# Patient Record
Sex: Male | Born: 1990 | Race: White | Hispanic: No | Marital: Married | Smoking: Never smoker
Health system: Southern US, Community
[De-identification: ages and names within clinical notes are randomized; demographics above are authoritative.]

## PROBLEM LIST (undated history)

## (undated) DIAGNOSIS — K219 Gastro-esophageal reflux disease without esophagitis: Secondary | ICD-10-CM

## (undated) DIAGNOSIS — M92529 Juvenile osteochondrosis of tibia tubercle, unspecified leg: Secondary | ICD-10-CM

## (undated) DIAGNOSIS — E7800 Pure hypercholesterolemia, unspecified: Secondary | ICD-10-CM

## (undated) DIAGNOSIS — M9250 Juvenile osteochondrosis of tibia and fibula, unspecified leg: Secondary | ICD-10-CM

---

## 2017-11-25 ENCOUNTER — Emergency Department (INDEPENDENT_AMBULATORY_CARE_PROVIDER_SITE_OTHER): Payer: 59

## 2017-11-25 ENCOUNTER — Other Ambulatory Visit: Payer: Self-pay

## 2017-11-25 ENCOUNTER — Emergency Department
Admission: EM | Admit: 2017-11-25 | Discharge: 2017-11-25 | Disposition: A | Discharge status: Home / Self Care | Payer: 59 | Source: Home / Self Care | Attending: Family Medicine | Admitting: Family Medicine

## 2017-11-25 DIAGNOSIS — S39012A Strain of muscle, fascia and tendon of lower back, initial encounter: Secondary | ICD-10-CM | POA: Diagnosis not present

## 2017-11-25 DIAGNOSIS — M5416 Radiculopathy, lumbar region: Secondary | ICD-10-CM | POA: Diagnosis not present

## 2017-11-25 HISTORY — DX: Juvenile osteochondrosis of tibia tubercle, unspecified leg: M92.529

## 2017-11-25 HISTORY — DX: Pure hypercholesterolemia, unspecified: E78.00

## 2017-11-25 HISTORY — DX: Juvenile osteochondrosis of tibia and fibula, unspecified leg: M92.50

## 2017-11-25 HISTORY — DX: Gastro-esophageal reflux disease without esophagitis: K21.9

## 2017-11-25 MED ORDER — PREDNISONE 20 MG PO TABS: ORAL_TABLET | ORAL | 0 refills | Status: DC

## 2017-11-25 MED ORDER — CYCLOBENZAPRINE HCL 10 MG PO TABS: 10.0000 mg | ORAL_TABLET | Freq: Three times a day (TID) | ORAL | 0 refills | Status: DC

## 2017-11-25 MED ORDER — HYDROCODONE-ACETAMINOPHEN 5-325 MG PO TABS: 1.0000 | ORAL_TABLET | Freq: Four times a day (QID) | ORAL | 0 refills | Status: DC | PRN

## 2017-11-25 MED ORDER — KETOROLAC TROMETHAMINE 30 MG/ML IJ SOLN
30.0000 mg | Freq: Once | INTRAMUSCULAR | Status: AC
  Administered 2017-11-25: 30 mg via INTRAMUSCULAR

## 2017-11-25 NOTE — Discharge Instructions (Signed)
Apply ice pack for 20 to 30 minutes, 3 to 4 times daily  Continue until pain and swelling decrease.  Use crutches for about 5 days, and begin walking as tolerated.  Begin range of motion and stretching exercises as tolerated.  After finishing prednisone, may begin Ibuprofen 200mg , 4 tabs every 8 hours with food.

## 2017-11-25 NOTE — ED Triage Notes (Signed)
Pt go out of bed this am and had severe back pain radiating down the left leg, stops in his calf.  Pain level a 10.  Took advil 600 mg about 1 hour ago.

## 2017-11-25 NOTE — ED Provider Notes (Signed)
[facility]    CSN: [id number] Arrival date & time: 11/25/17  0840     History   Chief Complaint Chief Complaint  Patient presents with  . Back Pain    HPI Patrick Velasquez is a 27 y.o. male.   About 45 minutes ago while climbing out of bed, patient felt sudden severe mid-low back pain radiating to both knees.  He denies past history of back pain.   He denies bowel or bladder dysfunction, and no saddle numbness.  He took 600mg  Ibuprofen prior to arrival   The history is provided by the patient.  Back Pain  Location:  Lumbar spine Quality:  Stabbing Radiates to:  L knee and R knee Pain severity:  Severe Pain is:  Same all the time Onset quality:  Sudden Duration:  1 hour Timing:  Constant Progression:  Unchanged Chronicity:  New Context: not recent illness and not recent injury   Relieved by:  Nothing Worsened by:  Movement Ineffective treatments:  NSAIDs Associated symptoms: paresthesias and tingling   Associated symptoms: no abdominal pain, no abdominal swelling, no bladder incontinence, no bowel incontinence, no chest pain, no dysuria, no fever, no headaches, no leg pain, no numbness, no pelvic pain, no perianal numbness, no weakness and no weight loss     Past Medical History:  Diagnosis Date  . Acid reflux   . High cholesterol   . Osgood-Schlatter's disease     There are no active problems to display for this patient.   History reviewed. No pertinent surgical history.     Home Medications    Prior to Admission medications   Medication Sig Start Date End Date Taking? Authorizing Provider  cyclobenzaprine (FLEXERIL) 10 MG tablet Take 1 tablet (10 mg total) by mouth 3 (three) times daily. 11/25/17   Lattie Haw, MD  HYDROcodone-acetaminophen (NORCO/VICODIN) 5-325 MG tablet Take 1 tablet by mouth every 6 (six) hours as needed for moderate pain. 11/25/17   Lattie Haw, MD  predniSONE (DELTASONE) 20 MG tablet Take one tab by mouth twice  daily for 4 days, then one daily for 3 days. Take with food. 11/25/17   Lattie Haw, MD    Family History History reviewed. No pertinent family history.  Social History Social History   Tobacco Use  . Smoking status: Never Smoker  . Smokeless tobacco: Never Used  Substance Use Topics  . Alcohol use: Yes  . Drug use: No     Allergies   Patient has no known allergies.   Review of Systems Review of Systems  Constitutional: Negative for fever and weight loss.  Cardiovascular: Negative for chest pain.  Gastrointestinal: Negative for abdominal pain and bowel incontinence.  Genitourinary: Negative for bladder incontinence, dysuria and pelvic pain.  Musculoskeletal: Positive for back pain.  Neurological: Positive for tingling and paresthesias. Negative for weakness, numbness and headaches.  All other systems reviewed and are negative.    Physical Exam Triage Vital Signs ED Triage Vitals  Enc Vitals Group     BP 11/25/17 0845 (!) 173/69     Pulse Rate 11/25/17 0845 77     Resp --      Temp 11/25/17 0845 97.7 F (36.5 C)     Temp Source 11/25/17 0845 Oral     SpO2 11/25/17 0845 100 %     Weight 11/25/17 0845 180 lb (81.6 kg)     Height 11/25/17 0845 6\' 3"  (1.905 m)     Head Circumference --  Peak Flow --      Pain Score 11/25/17 0846 10     Pain Loc --      Pain Edu? --      Excl. in GC? --    No data found.  Updated Vital Signs BP (!) 173/69 (BP Location: Right Arm)   Pulse 77   Temp 97.7 F (36.5 C) (Oral)   Ht 6\' 3"  (1.905 m)   Wt 180 lb (81.6 kg)   SpO2 100%   BMI 22.50 kg/m   Visual Acuity Right Eye Distance:   Left Eye Distance:   Bilateral Distance:    Right Eye Near:   Left Eye Near:    Bilateral Near:     Physical Exam  Constitutional: He appears well-developed and well-nourished. He appears distressed.  HENT:  Head: Normocephalic.  Right Ear: External ear normal.  Left Ear: External ear normal.  Mouth/Throat: Oropharynx is  clear and moist.  Eyes: EOM are normal. Pupils are equal, round, and reactive to light.  Neck: Normal range of motion.  Cardiovascular: Normal heart sounds.  Pulmonary/Chest: Breath sounds normal.  Abdominal: Soft. There is no tenderness.  Musculoskeletal: He exhibits no edema.       Lumbar back: He exhibits decreased range of motion and pain. He exhibits no tenderness, no bony tenderness, no swelling and no edema.       Back:  Back:  Range of motion decreased.  Patient experiences pain in primarily the midline and left paraspinous muscles from L2 to Sacral area, but there is no tenderness to palpation in this area. Straight leg raising test is negative.  Sitting knee extension test is negative.  Strength and sensation in the lower extremities is normal.  Patellar and achilles reflexes are normal   Neurological: He is alert.  Skin: Skin is warm and dry. He is not diaphoretic.     UC Treatments / Results  Labs (all labs ordered are listed, but only abnormal results are displayed) Labs Reviewed - No data to display  EKG  EKG Interpretation None       Radiology Dg Lumbar Spine Complete  Result Date: 11/25/2017 CLINICAL DATA:  Lumbago with lower extremity radicular symptoms EXAM: LUMBAR SPINE - COMPLETE 4+ VIEW COMPARISON:  None. FINDINGS: Frontal, lateral, spot lumbosacral lateral, and bilateral oblique views were obtained. There are 5 non-rib-bearing lumbar type vertebral bodies. There is no fracture or spondylolisthesis. Disc spaces appear unremarkable. There is no appreciable facet arthropathy. IMPRESSION: No fracture or spondylolisthesis.  No appreciable arthropathy. Electronically Signed   By: Bretta BangWilliam  Woodruff III M.D.   On: 11/25/2017 09:36    Procedures Procedures (including critical care time)  Medications Ordered in UC Medications  ketorolac (TORADOL) 30 MG/ML injection 30 mg (30 mg Intramuscular Given 11/25/17 0859)     Initial Impression / Assessment and Plan / UC  Course  I have reviewed the triage vital signs and the nursing notes.  Pertinent labs & imaging results that were available during my care of the patient were reviewed by me and considered in my medical decision making (see chart for details).    Administered Toradol 30mg  IM Begin prednisone burst/taper.  Rx for Flexeril. Rx for Lortab for pain control. Controlled Substance Prescriptions I have consulted the [place] Controlled Substances Registry for this patient, and feel the risk/benefit ratio today is favorable for proceeding with this prescription for a controlled substance.   Dispensed crutches. Apply ice pack for 20 to 30 minutes, 3 to 4 times  daily  Continue until pain and swelling decrease.  Use crutches for about 5 days, and begin walking as tolerated.  Begin range of motion and stretching exercises as tolerated.  After finishing prednisone, may begin Ibuprofen 200mg , 4 tabs every 8 hours with food. Followup with Dr. Rodney Langton or Dr. Clementeen Graham ([facility]) if not improving about two weeks.     Final Clinical Impressions(s) / UC Diagnoses   Final diagnoses:  Strain of lumbar region, initial encounter    ED Discharge Orders        Ordered    predniSONE (DELTASONE) 20 MG tablet     11/25/17 0953    cyclobenzaprine (FLEXERIL) 10 MG tablet  3 times daily     11/25/17 0953    HYDROcodone-acetaminophen (NORCO/VICODIN) 5-325 MG tablet  Every 6 hours PRN     11/25/17 0953           Lattie Haw, MD 11/25/17 1008

## 2018-05-26 ENCOUNTER — Other Ambulatory Visit: Payer: Self-pay

## 2018-05-26 ENCOUNTER — Encounter: Payer: Self-pay | Admitting: *Deleted

## 2018-05-26 ENCOUNTER — Emergency Department
Admission: EM | Admit: 2018-05-26 | Discharge: 2018-05-26 | Disposition: A | Discharge status: Home / Self Care | Payer: Self-pay | Source: Home / Self Care | Attending: Family Medicine | Admitting: Family Medicine

## 2018-05-26 DIAGNOSIS — H6502 Acute serous otitis media, left ear: Secondary | ICD-10-CM

## 2018-05-26 DIAGNOSIS — J069 Acute upper respiratory infection, unspecified: Secondary | ICD-10-CM

## 2018-05-26 MED ORDER — BENZONATATE 200 MG PO CAPS: ORAL_CAPSULE | ORAL | 0 refills | Status: DC

## 2018-05-26 MED ORDER — AZITHROMYCIN 250 MG PO TABS: ORAL_TABLET | ORAL | 0 refills | Status: DC

## 2018-05-26 NOTE — Discharge Instructions (Addendum)
Take plain guaifenesin (1200mg extended release tabs such as Mucinex) twice daily, with plenty of water, for cough and congestion.  May add Pseudoephedrine (30mg, one or two every 4 to 6 hours) for sinus congestion.  Get adequate rest.   °May use Afrin nasal spray (or generic oxymetazoline) each morning for about 5 days and then discontinue.  Also recommend using saline nasal spray several times daily and saline nasal irrigation (AYR is a common brand).  Use Flonase nasal spray each morning after using Afrin nasal spray and saline nasal irrigation. °Try warm salt water gargles for sore throat.  °Stop all antihistamines for now, and other non-prescription cough/cold preparations. °May take Ibuprofen 200mg, 4 tabs every 8 hours with food for fever, body aches, etc. °May take Delsym Cough Suppressant with Tessalon at bedtime for nighttime cough.  °

## 2018-05-26 NOTE — ED Provider Notes (Signed)
[facility]    CSN: [id number] Arrival date & time: 05/26/18  0900     History   Chief Complaint Chief Complaint  Patient presents with  . Cough    HPI Patrick Velasquez is a 27 y.o. male.   Patient developed fever, fatigue, arthralgias, and cough 3 days ago.  The fever has persisted, and he has had chills/sweats.  He has developed sinus congestion but no sore throat.  His cough is non-productive and worse at night.  He denies shortness of breath or pleuritic pain.  He has a past history of frequent childhood otitis media and ventilation tubes. His wife is approximately [redacted] weeks pregnant.  The history is provided by the patient.    Past Medical History:  Diagnosis Date  . Acid reflux   . High cholesterol   . Osgood-Schlatter's disease     There are no active problems to display for this patient.   History reviewed. No pertinent surgical history.     Home Medications    Prior to Admission medications   Medication Sig Start Date End Date Taking? Authorizing Provider  azithromycin (ZITHROMAX Z-PAK) 250 MG tablet Take 2 tabs today; then begin one tab once daily for 4 more days. 05/26/18   Lattie HawBeese, Asli Tokarski A, MD  benzonatate (TESSALON) 200 MG capsule Take one cap by mouth at bedtime as needed for cough.  May repeat in 4 to 6 hours 05/26/18   Lattie HawBeese, Chrisa Hassan A, MD    Family History History reviewed. No pertinent family history.  Social History Social History   Tobacco Use  . Smoking status: Never Smoker  . Smokeless tobacco: Never Used  Substance Use Topics  . Alcohol use: Yes  . Drug use: No     Allergies   Patient has no known allergies.   Review of Systems Review of Systems No sore throat + cough No pleuritic pain No wheezing + nasal congestion + post-nasal drainage No sinus pain/pressure No itchy/red eyes ? earache No hemoptysis No SOB + fever, + chills No nausea No vomiting No abdominal pain No diarrhea No urinary symptoms No skin  rash + fatigue + myalgias No headache Used OTC meds without relief  Physical Exam Triage Vital Signs ED Triage Vitals  Enc Vitals Group     BP 05/26/18 0925 (!) 143/90     Pulse Rate 05/26/18 0925 90     Resp 05/26/18 0925 18     Temp 05/26/18 0925 99.5 F (37.5 C)     Temp Source 05/26/18 0925 Oral     SpO2 05/26/18 0925 99 %     Weight 05/26/18 0926 184 lb (83.5 kg)     Height 05/26/18 0926 6\' 3"  (1.905 m)     Head Circumference --      Peak Flow --      Pain Score 05/26/18 0926 0     Pain Loc --      Pain Edu? --      Excl. in GC? --    No data found.  Updated Vital Signs BP (!) 143/90 (BP Location: Right Arm)   Pulse 90   Temp 99.5 F (37.5 C) (Oral)   Resp 18   Ht 6\' 3"  (1.905 m)   Wt 184 lb (83.5 kg)   SpO2 99%   BMI 23.00 kg/m   Visual Acuity Right Eye Distance:   Left Eye Distance:   Bilateral Distance:    Right Eye Near:   Left Eye Near:  Bilateral Near:     Physical Exam Nursing notes and Vital Signs reviewed. Appearance:  Patient appears stated age, and in no acute distress Eyes:  Pupils are equal, round, and reactive to light and accomodation.  Extraocular movement is intact.  Conjunctivae are not inflamed  Ears:  Canals normal.  Right tympanic membrane normal.  Left tympanic membrane has serous effusion. Nose:  Congested turbinates.  No sinus tenderness.  Pharynx:  Normal Neck:  Supple.  No adenopathy..   Lungs:  Clear to auscultation.  Breath sounds are equal.  Moving air well. Heart:  Regular rate and rhythm without murmurs, rubs, or gallops.  Abdomen:  Nontender without masses or hepatosplenomegaly.  Bowel sounds are present.  No CVA or flank tenderness.  Extremities:  No edema.  Skin:  No rash present.    UC Treatments / Results  Labs (all labs ordered are listed, but only abnormal results are displayed) Labs Reviewed - No data to display  EKG None  Radiology No results found.  Procedures Procedures (including critical care  time)  Medications Ordered in UC Medications - No data to display  Initial Impression / Assessment and Plan / UC Course  I have reviewed the triage vital signs and the nursing notes.  Pertinent labs & imaging results that were available during my care of the patient were reviewed by me and considered in my medical decision making (see chart for details).    Begin Z-pak.  Prescription written for Benzonatate [facility]) to take at bedtime for night-time cough.  Followup with Family Doctor if not improved in 7 to 10 days.   Final Clinical Impressions(s) / UC Diagnoses   Final diagnoses:  Upper respiratory tract infection, unspecified type  Acute serous otitis media of left ear, recurrence not specified     Discharge Instructions     Take plain guaifenesin (1200mg  extended release tabs such as Mucinex) twice daily, with plenty of water, for cough and congestion.  May add Pseudoephedrine (30mg , one or two every 4 to 6 hours) for sinus congestion.  Get adequate rest.   May use Afrin nasal spray (or generic oxymetazoline) each morning for about 5 days and then discontinue.  Also recommend using saline nasal spray several times daily and saline nasal irrigation (AYR is a common brand).  Use Flonase nasal spray each morning after using Afrin nasal spray and saline nasal irrigation. Try warm salt water gargles for sore throat.  Stop all antihistamines for now, and other non-prescription cough/cold preparations. May take Ibuprofen 200mg , 4 tabs every 8 hours with food for fever, body aches, etc. May take Delsym Cough Suppressant with Tessalon at bedtime for nighttime cough.       ED Prescriptions    Medication Sig Dispense Auth. Provider   azithromycin (ZITHROMAX Z-PAK) 250 MG tablet Take 2 tabs today; then begin one tab once daily for 4 more days. 6 tablet Lattie Haw, MD   benzonatate (TESSALON) 200 MG capsule Take one cap by mouth at bedtime as needed for cough.  May repeat in 4 to  6 hours 15 capsule Cathren Harsh Tera Mater, MD        Lattie Haw, MD 05/26/18 [phone]

## 2018-05-26 NOTE — ED Triage Notes (Signed)
Pt c/o nonproductive cough, temp up to 101 F and nasal congestion x  3 days.

## 2018-06-17 DIAGNOSIS — M609 Myositis, unspecified: Secondary | ICD-10-CM | POA: Diagnosis not present

## 2018-06-17 DIAGNOSIS — M545 Low back pain: Secondary | ICD-10-CM | POA: Diagnosis not present

## 2018-06-17 DIAGNOSIS — M5387 Other specified dorsopathies, lumbosacral region: Secondary | ICD-10-CM | POA: Diagnosis not present

## 2018-06-17 DIAGNOSIS — M9903 Segmental and somatic dysfunction of lumbar region: Secondary | ICD-10-CM | POA: Diagnosis not present

## 2018-07-01 DIAGNOSIS — M9903 Segmental and somatic dysfunction of lumbar region: Secondary | ICD-10-CM | POA: Diagnosis not present

## 2018-07-01 DIAGNOSIS — M9901 Segmental and somatic dysfunction of cervical region: Secondary | ICD-10-CM | POA: Diagnosis not present

## 2018-07-01 DIAGNOSIS — M609 Myositis, unspecified: Secondary | ICD-10-CM | POA: Diagnosis not present

## 2018-07-01 DIAGNOSIS — M545 Low back pain: Secondary | ICD-10-CM | POA: Diagnosis not present

## 2018-07-15 DIAGNOSIS — M609 Myositis, unspecified: Secondary | ICD-10-CM | POA: Diagnosis not present

## 2018-07-15 DIAGNOSIS — M545 Low back pain: Secondary | ICD-10-CM | POA: Diagnosis not present

## 2018-07-15 DIAGNOSIS — M9903 Segmental and somatic dysfunction of lumbar region: Secondary | ICD-10-CM | POA: Diagnosis not present

## 2018-07-15 DIAGNOSIS — M5387 Other specified dorsopathies, lumbosacral region: Secondary | ICD-10-CM | POA: Diagnosis not present

## 2018-07-29 DIAGNOSIS — M9903 Segmental and somatic dysfunction of lumbar region: Secondary | ICD-10-CM | POA: Diagnosis not present

## 2018-07-29 DIAGNOSIS — M545 Low back pain: Secondary | ICD-10-CM | POA: Diagnosis not present

## 2018-07-29 DIAGNOSIS — M609 Myositis, unspecified: Secondary | ICD-10-CM | POA: Diagnosis not present

## 2018-07-29 DIAGNOSIS — M5387 Other specified dorsopathies, lumbosacral region: Secondary | ICD-10-CM | POA: Diagnosis not present

## 2018-08-12 DIAGNOSIS — M5387 Other specified dorsopathies, lumbosacral region: Secondary | ICD-10-CM | POA: Diagnosis not present

## 2018-08-12 DIAGNOSIS — M545 Low back pain: Secondary | ICD-10-CM | POA: Diagnosis not present

## 2018-08-12 DIAGNOSIS — M9903 Segmental and somatic dysfunction of lumbar region: Secondary | ICD-10-CM | POA: Diagnosis not present

## 2018-08-12 DIAGNOSIS — M609 Myositis, unspecified: Secondary | ICD-10-CM | POA: Diagnosis not present

## 2018-08-15 DIAGNOSIS — Z23 Encounter for immunization: Secondary | ICD-10-CM | POA: Diagnosis not present

## 2018-08-15 DIAGNOSIS — Z0000 Encounter for general adult medical examination without abnormal findings: Secondary | ICD-10-CM | POA: Diagnosis not present

## 2018-08-15 DIAGNOSIS — K219 Gastro-esophageal reflux disease without esophagitis: Secondary | ICD-10-CM | POA: Diagnosis not present

## 2018-08-15 DIAGNOSIS — Z6823 Body mass index (BMI) 23.0-23.9, adult: Secondary | ICD-10-CM | POA: Diagnosis not present

## 2018-08-26 DIAGNOSIS — M545 Low back pain: Secondary | ICD-10-CM | POA: Diagnosis not present

## 2018-08-26 DIAGNOSIS — M609 Myositis, unspecified: Secondary | ICD-10-CM | POA: Diagnosis not present

## 2018-08-26 DIAGNOSIS — M5387 Other specified dorsopathies, lumbosacral region: Secondary | ICD-10-CM | POA: Diagnosis not present

## 2018-08-26 DIAGNOSIS — M9903 Segmental and somatic dysfunction of lumbar region: Secondary | ICD-10-CM | POA: Diagnosis not present

## 2019-05-19 ENCOUNTER — Other Ambulatory Visit: Payer: Self-pay

## 2019-05-19 ENCOUNTER — Emergency Department (INDEPENDENT_AMBULATORY_CARE_PROVIDER_SITE_OTHER)
Admission: EM | Admit: 2019-05-19 | Discharge: 2019-05-19 | Disposition: A | Discharge status: Home / Self Care | Payer: Self-pay | Source: Home / Self Care

## 2019-05-19 ENCOUNTER — Encounter: Payer: Self-pay | Admitting: *Deleted

## 2019-05-19 DIAGNOSIS — M5442 Lumbago with sciatica, left side: Secondary | ICD-10-CM

## 2019-05-19 MED ORDER — CYCLOBENZAPRINE HCL 5 MG PO TABS: 5.0000 mg | ORAL_TABLET | Freq: Two times a day (BID) | ORAL | 0 refills | Status: AC | PRN

## 2019-05-19 MED ORDER — PREDNISONE 50 MG PO TABS: 50.0000 mg | ORAL_TABLET | Freq: Every day | ORAL | 0 refills | Status: AC

## 2019-05-19 MED ORDER — METHYLPREDNISOLONE ACETATE 80 MG/ML IJ SUSP
80.0000 mg | Freq: Once | INTRAMUSCULAR | Status: AC
  Administered 2019-05-19: 80 mg via INTRAMUSCULAR

## 2019-05-19 NOTE — ED Triage Notes (Signed)
Pt c/o pain in the center of his lower back radiating to his legs; LT leg is worse. Denies injury. Took IBF yesterday.

## 2019-05-19 NOTE — ED Provider Notes (Signed)
[facility]    CSN: [id number] Arrival date & time: 05/19/19  2671     History   Chief Complaint Chief Complaint  Patient presents with  . Back Pain    HPI Patrick Velasquez is a 28 y.o. male.   HPI Patrick Velasquez is a 28 y.o. male presenting to UC with c/o sudden onset lower mid back pain with radiation of pain down his legs, predominantly down his Left thigh.  Pain started yesterday while driving his car. Hx of similar pain about 2 years ago that started when he was putting socks on. Pain at that time brought him to his knees.  Pain is 5/10 at this time, worse with certain movements.  He did take ibuprofen yesterday with mild relief, no medication taken today.  He reports doing well with prednisone and the muscle relaxer last time and had gone to a chiropractor for about 1 year.  This past year, his wife gave birth to their first child.  She is now 79mo old and starting to cruise. Pt believes bending down to pick up his growing daughter could be contributing to his current back pain.  Denies change in bowel or bladder habits. No numbness or tingling in arms, legs or groin.    Past Medical History:  Diagnosis Date  . Acid reflux   . High cholesterol   . Osgood-Schlatter's disease     There are no active problems to display for this patient.   History reviewed. No pertinent surgical history.     Home Medications    Prior to Admission medications   Medication Sig Start Date End Date Taking? Authorizing Provider  cyclobenzaprine (FLEXERIL) 5 MG tablet Take 1-2 tablets (5-10 mg total) by mouth 2 (two) times daily as needed for muscle spasms. 05/19/19   Noe Gens, PA-C  predniSONE (DELTASONE) 50 MG tablet Take 1 tablet (50 mg total) by mouth daily with breakfast for 5 days. 05/19/19 05/24/19  Noe Gens, PA-C    Family History History reviewed. No pertinent family history.  Social History Social History   Tobacco Use  . Smoking status: Never Smoker  .  Smokeless tobacco: Never Used  Substance Use Topics  . Alcohol use: Yes  . Drug use: No     Allergies   Patient has no known allergies.   Review of Systems Review of Systems  Genitourinary: Negative for dysuria, flank pain and frequency.  Musculoskeletal: Positive for back pain and myalgias. Negative for gait problem.  Neurological: Negative for weakness and numbness.     Physical Exam Triage Vital Signs ED Triage Vitals  Enc Vitals Group     BP      Pulse      Resp      Temp      Temp src      SpO2      Weight      Height      Head Circumference      Peak Flow      Pain Score      Pain Loc      Pain Edu?      Excl. in [place]?    No data found.  Updated Vital Signs BP (!) 166/68 (BP Location: Right Arm)   Pulse 63   Temp 98.4 F (36.9 C) (Oral)   Resp 18   SpO2 100%   Visual Acuity Right Eye Distance:   Left Eye Distance:   Bilateral Distance:    Right  Eye Near:   Left Eye Near:    Bilateral Near:     Physical Exam Vitals signs and nursing note reviewed.  Constitutional:      Appearance: Normal appearance. He is well-developed.  HENT:     Head: Normocephalic and atraumatic.  Neck:     Musculoskeletal: Normal range of motion.  Cardiovascular:     Rate and Rhythm: Normal rate and regular rhythm.  Pulmonary:     Effort: Pulmonary effort is normal.     Breath sounds: Normal breath sounds.  Musculoskeletal: Normal range of motion.        General: Tenderness present.     Comments: Mild tenderness to lower lumbar spine and surrounding lumbar muscles. No step-offs or crepitus.  Negative straight leg raise.  Slight antalgic gait, improves after pt takes a few steps.   Skin:    General: Skin is warm and dry.     Findings: No erythema or rash.  Neurological:     General: No focal deficit present.     Mental Status: He is alert and oriented to person, place, and time.  Psychiatric:        Behavior: Behavior normal.      UC Treatments / Results   Labs (all labs ordered are listed, but only abnormal results are displayed) Labs Reviewed - No data to display  EKG   Radiology No results found.  Procedures Procedures (including critical care time)  Medications Ordered in UC Medications  methylPREDNISolone acetate (DEPO-MEDROL) injection 80 mg (80 mg Intramuscular Given 05/19/19 0916)    Initial Impression / Assessment and Plan / UC Course  I have reviewed the triage vital signs and the nursing notes.  Pertinent labs & imaging results that were available during my care of the patient were reviewed by me and considered in my medical decision making (see chart for details).     No red flag symptoms Hx and exam c/w low back strain with Left side sciatica Reviewed PMH Will tx conservatively Resource guide for Sports Medicine provided.  Final Clinical Impressions(s) / UC Diagnoses   Final diagnoses:  Acute midline low back pain with left-sided sciatica     Discharge Instructions      You may take 500mg  acetaminophen every 4-6 hours or in combination with ibuprofen 400-600mg  every 6-8 hours as needed for pain and inflammation.  You were given a shot of depo-medrol (a steroid) today to help with muscle pain and swelling.  You have been prescribed prednisone, an oral steroid.  You may start this medication tomorrow with breakfast.    Taking an antiinflammatory medication with prednisone can increase your risk of stomach ulcer and/or stomach bleeds.  It is recommended you take these medications with food and do not take more medication than needed.    Please follow up with Sports Medicine in 1-2 weeks if not improving, or for ongoing treatment of recurrent low back pain.      ED Prescriptions    Medication Sig Dispense Auth. Provider   predniSONE (DELTASONE) 50 MG tablet Take 1 tablet (50 mg total) by mouth daily with breakfast for 5 days. 5 tablet Waylan RocherPhelps, Kayloni Rocco O, PA-C   cyclobenzaprine (FLEXERIL) 5 MG tablet Take 1-2  tablets (5-10 mg total) by mouth 2 (two) times daily as needed for muscle spasms. 30 tablet Lurene ShadowPhelps, Jaysun Wessels O, PA-C     Controlled Substance Prescriptions  Controlled Substance Registry consulted? Not Applicable   Rolla Platehelps, Latifa Noble O, PA-C 05/19/19 1154

## 2019-05-19 NOTE — Discharge Instructions (Signed)
°  You may take 500mg  acetaminophen every 4-6 hours or in combination with ibuprofen 400-600mg  every 6-8 hours as needed for pain and inflammation.  You were given a shot of depo-medrol (a steroid) today to help with muscle pain and swelling.  You have been prescribed prednisone, an oral steroid.  You may start this medication tomorrow with breakfast.    Taking an antiinflammatory medication with prednisone can increase your risk of stomach ulcer and/or stomach bleeds.  It is recommended you take these medications with food and do not take more medication than needed.    Please follow up with Sports Medicine in 1-2 weeks if not improving, or for ongoing treatment of recurrent low back pain.

## 2019-05-27 ENCOUNTER — Other Ambulatory Visit: Payer: Self-pay

## 2019-05-27 ENCOUNTER — Encounter: Payer: Self-pay | Admitting: Sports Medicine

## 2019-05-27 ENCOUNTER — Ambulatory Visit (INDEPENDENT_AMBULATORY_CARE_PROVIDER_SITE_OTHER): Payer: BC Managed Care – PPO

## 2019-05-27 ENCOUNTER — Ambulatory Visit (INDEPENDENT_AMBULATORY_CARE_PROVIDER_SITE_OTHER): Payer: BC Managed Care – PPO | Admitting: Sports Medicine

## 2019-05-27 DIAGNOSIS — G8929 Other chronic pain: Secondary | ICD-10-CM

## 2019-05-27 DIAGNOSIS — M545 Low back pain, unspecified: Secondary | ICD-10-CM | POA: Insufficient documentation

## 2019-05-27 MED ORDER — PREDNISONE 10 MG (48) PO TBPK: ORAL_TABLET | Freq: Every day | ORAL | 0 refills | Status: AC

## 2019-05-27 NOTE — Progress Notes (Signed)
Subjective:    CC: Chronic low back pain  HPI:  Patrick Velasquez is a very pleasant 28 year old male, for the past year and a half he has had pain in his bilateral low back, worse with sitting, flexion, Valsalva, radiation into the buttocks and thighs but not past the knees, no progressive weakness, he has failed greater than 6 weeks of physician directed conservative measures.   I reviewed the past medical history, family history, social history, surgical history, and allergies today and no changes were needed.  Please see the problem list section below in epic for further details.  Past Medical History: Past Medical History:  Diagnosis Date  . Acid reflux   . High cholesterol   . Osgood-Schlatter's disease    Past Surgical History: No past surgical history on file. Social History: Social History   Socioeconomic History  . Marital status: Married    Spouse name: Not on file  . Number of children: Not on file  . Years of education: Not on file  . Highest education level: Not on file  Occupational History  . Not on file  Social Needs  . Financial resource strain: Not on file  . Food insecurity    Worry: Not on file    Inability: Not on file  . Transportation needs    Medical: Not on file    Non-medical: Not on file  Tobacco Use  . Smoking status: Never Smoker  . Smokeless tobacco: Never Used  Substance and Sexual Activity  . Alcohol use: Yes  . Drug use: No  . Sexual activity: Not on file  Lifestyle  . Physical activity    Days per week: Not on file    Minutes per session: Not on file  . Stress: Not on file  Relationships  . Social Herbalist on phone: Not on file    Gets together: Not on file    Attends religious service: Not on file    Active member of club or organization: Not on file    Attends meetings of clubs or organizations: Not on file    Relationship status: Not on file  Other Topics Concern  . Not on file  Social History Narrative  . Not on  file   Family History: No family history on file. Allergies: No Known Allergies Medications: See med rec.  Review of Systems: No headache, visual changes, nausea, vomiting, diarrhea, constipation, dizziness, abdominal pain, skin rash, fevers, chills, night sweats, swollen lymph nodes, weight loss, chest pain, body aches, joint swelling, muscle aches, shortness of breath, mood changes, visual or auditory hallucinations.  Objective:    General: Well Developed, well nourished, and in no acute distress.  Neuro: Alert and oriented x3, extra-ocular muscles intact, sensation grossly intact.  HEENT: Normocephalic, atraumatic, pupils equal round reactive to light, neck supple, no masses, no lymphadenopathy, thyroid nonpalpable.  Skin: Warm and dry, no rashes noted.  Cardiac: Regular rate and rhythm, no murmurs rubs or gallops.  Respiratory: Clear to auscultation bilaterally. Not using accessory muscles, speaking in full sentences.  Abdominal: Soft, nontender, nondistended, positive bowel sounds, no masses, no organomegaly.  Back Exam:  Inspection: Unremarkable  Motion: Flexion 45 deg, Extension 45 deg, Side Bending to 45 deg bilaterally,  Rotation to 45 deg bilaterally  SLR laying: Negative  XSLR laying: Negative  Palpable tenderness: None. FABER: negative. Sensory change: Gross sensation intact to all lumbar and sacral dermatomes.  Reflexes: 2+ at both patellar tendons, 2+ at achilles tendons, Babinski's  downgoing.  Strength at foot  Plantar-flexion: 5/5 Dorsi-flexion: 5/5 Eversion: 5/5 Inversion: 5/5  Leg strength  Quad: 5/5 Hamstring: 5/5 Hip flexor: 5/5 Hip abductors: 5/5  Gait unremarkable.  Decreased space between the L5 and S1 vertebrae  Impression and Recommendations:    The patient was counselled, risk factors were discussed, anticipatory guidance given.  Low back pain Axial discogenic back pain without progressive weakness or radiculopathy. It has been present for a year  and a half now in spite of physician directed conservative measures. For this reason we are getting an updated x-ray, MRI, formal PT, and a 12-day prednisone taper. Return to see me in 6 weeks, interventional treatment if no better.   ___________________________________________ Ihor Austinhomas J. Benjamin Stainhekkekandam, M.D., ABFM., CAQSM. Primary Care and Sports Medicine [facility]  Adjunct Professor of Family Medicine  [employer]

## 2019-05-27 NOTE — Assessment & Plan Note (Signed)
Axial discogenic back pain without progressive weakness or radiculopathy. It has been present for a year and a half now in spite of physician directed conservative measures. For this reason we are getting an updated x-ray, MRI, formal PT, and a 12-day prednisone taper. Return to see me in 6 weeks, interventional treatment if no better.

## 2019-06-07 ENCOUNTER — Ambulatory Visit (INDEPENDENT_AMBULATORY_CARE_PROVIDER_SITE_OTHER): Payer: BC Managed Care – PPO

## 2019-06-07 ENCOUNTER — Other Ambulatory Visit: Payer: Self-pay

## 2019-06-07 DIAGNOSIS — M545 Low back pain: Secondary | ICD-10-CM

## 2019-06-07 DIAGNOSIS — G8929 Other chronic pain: Secondary | ICD-10-CM | POA: Diagnosis not present

## 2019-06-08 ENCOUNTER — Ambulatory Visit: Payer: Self-pay | Admitting: Rehabilitative and Restorative Service Providers"

## 2019-06-10 ENCOUNTER — Other Ambulatory Visit: Payer: Self-pay

## 2019-06-10 ENCOUNTER — Ambulatory Visit (INDEPENDENT_AMBULATORY_CARE_PROVIDER_SITE_OTHER): Payer: BC Managed Care – PPO | Admitting: Physical Therapy

## 2019-06-10 ENCOUNTER — Encounter: Payer: Self-pay | Admitting: Physical Therapy

## 2019-06-10 DIAGNOSIS — G8929 Other chronic pain: Secondary | ICD-10-CM

## 2019-06-10 DIAGNOSIS — M545 Low back pain, unspecified: Secondary | ICD-10-CM

## 2019-06-10 NOTE — Therapy (Signed)
[facility] [address] [place], [place], [address] Phone: [phone]   Fax:  [phone]  Physical Therapy Evaluation  Patient Details  Name: Oren BeckmannSamuel Genis MRN: [id number] Date of Birth: 1991/03/12 Referring Provider (PT): Rodney Langtonhomas Thekkekandam MD   Encounter Date: 06/10/2019  PT End of Session - 06/10/19 1106    Visit Number  1    Number of Visits  8    Date for PT Re-Evaluation  07/08/19    PT Start Time  1106    PT Stop Time  1148    PT Time Calculation (min)  42 min    Activity Tolerance  Patient tolerated treatment well    Behavior During Therapy  [facility] for tasks assessed/performed       Past Medical History:  Diagnosis Date  . Acid reflux   . High cholesterol   . Osgood-Schlatter's disease     History reviewed. No pertinent surgical history.  There were no vitals filed for this visit.   Subjective Assessment - 06/10/19 1109    Subjective  January 2019 patient had episode of severe backpain with radiculopathy. Went to ER. Resolved in one month. About 6 weeks ago, pt was driving in his car and felt pain bil to knees and in back. MRI shows central disc protrusion at L5/S1. Pain has resolved mostly. He stil feels pressure. No LE radiculopathy for aleast a week.    Pertinent History  disc protrusion L5/S1    How long can you sit comfortably?  min    Diagnostic tests  mri    Patient Stated Goals  to help with his posture and prevent further back pain    Currently in Pain?  No/denies         [facility] - 06/10/19 0001      Assessment   Medical Diagnosis  chronic bil LBP without sciatica    Referring Provider (PT)  Rodney Langtonhomas Thekkekandam MD    Onset Date/Surgical Date  04/29/19    Hand Dominance  Right    Next MD Visit  after therapy    Prior Therapy  no      Precautions   Precaution Comments  advised to use stand up desk      Balance Screen   Has the patient fallen in the past 6 months  No    Has the patient had a decrease in activity level because of a fear of falling?   No    Is the patient reluctant to leave their home because of a fear of falling?   No      Home Public house managernvironment   Living Environment  Private residence    Living Arrangements  Spouse/significant other      Prior Function   Level of Independence  Independent    Vocation  Full time employment    Vocation Requirements  computer work    Leisure  new child      Posture/Postural Control   Posture Comments  Posture WNL other than rounded shoulders and forward head      ROM / Strength   AROM / PROM / Strength  AROM;Strength      AROM   AROM Assessment Site  Lumbar    Lumbar Flexion  limited by HS    Lumbar Extension  full    Lumbar - Right Side Bend  full with tightness left side    Lumbar - Left Side Bend  full with tightness right side  Lumbar - Right Rotation  full    Lumbar - Left Rotation  full      Strength   Overall Strength Comments  Grossly 5/5 BLE      Flexibility   Soft Tissue Assessment /Muscle Length  yes    Hamstrings  marked bil tightness    Quadriceps  bil tightness    Piriformis  right mild tightness    Quadratus Lumborum  tight bil hip flexors R>L      Palpation   Spinal mobility  mild decrease with pain left PA L4 and L5    Palpation comment  mild tenderness left QL      Special Tests    Special Tests  Lumbar    Lumbar Tests  Prone Knee Bend Test      Prone Knee Bend Test   Findings  Positive    Side  Right    Comment  felt in left LB                Objective measurements completed on examination: See above findings.              PT Education - 06/10/19 1310    Education Details  HEP    Person(s) Educated  Patient    Methods  Explanation;Demonstration;Handout    Comprehension  Verbalized understanding;Returned demonstration       PT Short Term Goals - 06/10/19 1310      PT SHORT TERM GOAL #1   Title  STG=LTG        PT Long Term Goals -  06/10/19 1311      PT LONG TERM GOAL #1   Title  Ind with HEP to prevent further injury    Time  4    Period  Weeks    Status  New    Target Date  07/08/19      PT LONG TERM GOAL #2   Title  Patient able to demonstrate correct body mechanics for lifting and carrying to prevent further injury    Time  4    Period  Weeks    Status  New      PT LONG TERM GOAL #3   Title  Patient able to sit for one hour without increased pain in his low back.    Time  4    Period  Weeks    Status  New             Plan - 06/10/19 1152    Clinical Impression Statement  Patient presents with c/o of LBP with intermittent radicular pain bil which started in mid June. He has a central disc protrusion at L5/S1. Pain has resolved somewhat but he still has pressure in low back and pain sometimes mainly with prolonged sitting. He sits at a computer for work and has a 6998-month old baby. He has good lumbar ROM but marked tightness in bil HS and HF. He will benefit from education on ADL modifcations to avoid flexion, functional strengthening for correct body mechanics and stabiization as well as TE to address flexibility deficits.    Examination-Activity Limitations  Sit    Stability/Clinical Decision Making  Stable/Uncomplicated    Clinical Decision Making  Low    Rehab Potential  Excellent    PT Frequency  2x / week    PT Duration  4 weeks    PT Treatment/Interventions  Electrical Stimulation;Moist Heat;Traction;Therapeutic exercise;Therapeutic activities;Neuromuscular re-education;Patient/family education;Manual techniques;Dry needling    PT Next Visit  Plan  Review HEP, ADL modifications, body mechanics, squats, lumbar stab, ext biased TE    PT Home Exercise Plan  [email or web address]       Patient will benefit from skilled therapeutic intervention in order to improve the following deficits and impairments:  Decreased mobility, Impaired flexibility, Decreased activity tolerance  Visit Diagnosis: 1. Chronic  midline low back pain, unspecified whether sciatica present        Problem List Patient Active Problem List   Diagnosis Date Noted  . Low back pain 05/27/2019    Madelyn Flavors PT 06/10/2019, 1:19 PM  [facility] [place], [place], [address] Phone: [phone]   Fax:  [phone]  Name: Zalman Hull MRN: [id number] Date of Birth: September 03, 1991

## 2019-06-10 NOTE — Patient Instructions (Signed)
Access Code: [email or web address]  URL: [email or web address]  Date: 06/10/2019  Prepared by: Almyra Free Veronda Gabor   Exercises Seated Correct Posture Hamstring Stretch in Doorway - 3 reps - 1 sets - 30-60 sec hold - 2x daily - 7x weekly Standing Hip Flexor Stretch on Chair - 3 reps - 1 sets - 30-60 sec hold - 2x daily - 7x weekly Prone Press Up - 10-15 reps - 1 second hold - 4x daily Standing Lumbar Extension - 10-15 reps - 1 sets - 1 second hold - 4x daily

## 2019-06-17 ENCOUNTER — Encounter: Payer: BC Managed Care – PPO | Admitting: Physical Therapy

## 2019-06-19 ENCOUNTER — Encounter: Payer: Self-pay | Admitting: Physical Therapy

## 2019-06-19 ENCOUNTER — Ambulatory Visit (INDEPENDENT_AMBULATORY_CARE_PROVIDER_SITE_OTHER): Payer: BC Managed Care – PPO | Admitting: Physical Therapy

## 2019-06-19 ENCOUNTER — Encounter: Payer: BC Managed Care – PPO | Admitting: Physical Therapy

## 2019-06-19 ENCOUNTER — Other Ambulatory Visit: Payer: Self-pay

## 2019-06-19 DIAGNOSIS — M545 Low back pain: Secondary | ICD-10-CM

## 2019-06-19 DIAGNOSIS — G8929 Other chronic pain: Secondary | ICD-10-CM | POA: Diagnosis not present

## 2019-06-19 NOTE — Therapy (Signed)
[place], [place], [address] Phone: [phone]   Fax:  [phone]  Physical Therapy Treatment  Patient Details  Name: Patrick Velasquez MRN: [id number] Date of Birth: 1991/02/25 Referring Provider (PT): Aundria Mems MD   Encounter Date: 06/19/2019  PT End of Session - 06/19/19 1122    Visit Number  2    Number of Visits  8    Date for PT Re-Evaluation  07/08/19    PT Start Time  0092    PT Stop Time  1141    PT Time Calculation (min)  39 min       Past Medical History:  Diagnosis Date  . Acid reflux   . High cholesterol   . Osgood-Schlatter's disease     History reviewed. No pertinent surgical history.  There were no vitals filed for this visit.  Subjective Assessment - 06/19/19 1107    Subjective  Pt reports he has done HEP 3x over last 10 days.  He feels about the same.  He has been squatting to pick up baby, but bends over "the wrong way" all the time.    Pertinent History  disc protrusion L5/S1    Patient Stated Goals  to help with his posture and prevent further back pain    Currently in Pain?  No/denies         [facility] PT Assessment - 06/19/19 0001      Assessment   Medical Diagnosis  chronic bil LBP without sciatica    Referring Provider (PT)  Aundria Mems MD    Onset Date/Surgical Date  04/29/19    Hand Dominance  Right    Next MD Visit  after therapy    Prior Therapy  no      [facility] Adult PT Treatment/Exercise - 06/19/19 0001      Lumbar Exercises: Stretches   Passive Hamstring Stretch  Right;Left;2 reps;30 seconds    Hip Flexor Stretch  Right;Left;1 rep;30 seconds    Standing Extension  3 reps;5 seconds    Prone on Elbows Stretch  1 rep;20 seconds    Press Ups  5 reps   3 sec hold   Piriformis Stretch  Right;Left;2 reps;30 seconds      Lumbar Exercises: Aerobic   Tread Mill  5 min, up to 3.2 mph      Lumbar Exercises: Seated   Sit to Stand  10 reps    TA engaged, slow and soft landing.    Other Seated Lumbar Exercises  3 part core x 5 sec x 3 reps       Lumbar Exercises: Supine   AB Set Limitations  3 part core x 5 sec x 5 reps    Other Supine Lumbar Exercises  sit to/from supine via log roll with TA engaged x 4 reps       Lumbar Exercises: Quadruped   Other Quadruped Lumbar Exercises  sit/ stand to quadruped to prone position and reverse with cues for TA engagment and posture.              PT Education - 06/19/19 1155    Education Details  posture and body mechanics    Person(s) Educated  Patient    Methods  Explanation;Demonstration;Verbal cues;Handout    Comprehension  Returned demonstration;Verbalized understanding       PT Short Term Goals - 06/10/19 1310      PT SHORT TERM GOAL #1   Title  STG=LTG  PT Long Term Goals - 06/10/19 1311      PT LONG TERM GOAL #1   Title  Ind with HEP to prevent further injury    Time  4    Period  Weeks    Status  New    Target Date  07/08/19      PT LONG TERM GOAL #2   Title  Patient able to demonstrate correct body mechanics for lifting and carrying to prevent further injury    Time  4    Period  Weeks    Status  New      PT LONG TERM GOAL #3   Title  Patient able to sit for one hour without increased pain in his low back.    Time  4    Period  Weeks    Status  New            Plan - 06/19/19 1145    Clinical Impression Statement  Pt tolerated all exercises well, and returned demo of good transitional movements with TA engaged without any difficulty.  Pt given posture and body mechanics hand out and was shown demo of several scenarios.  No goals met yet; only 2nd visit.    Rehab Potential  Excellent    PT Frequency  2x / week    PT Duration  4 weeks    PT Treatment/Interventions  Electrical Stimulation;Moist Heat;Traction;Therapeutic exercise;Therapeutic activities;Neuromuscular re-education;Patient/family education;Manual techniques;Dry needling    PT  Next Visit Plan  extension biased exercises; continue back care and body mechanics education.    PT Home Exercise Plan  [email or web address]    Consulted and Agree with Plan of Care  Patient       Patient will benefit from skilled therapeutic intervention in order to improve the following deficits and impairments:  Decreased mobility, Impaired flexibility, Decreased activity tolerance  Visit Diagnosis: 1. Chronic midline low back pain, unspecified whether sciatica present        Problem List Patient Active Problem List   Diagnosis Date Noted  . Low back pain 05/27/2019   Kerin Perna, PTA 06/19/19 11:58 AM  [facility] [place], [place], [address] Phone: [phone]   Fax:  [phone]  Name: Capri Raben MRN: [id number] Date of Birth: May 28, 1991

## 2019-06-19 NOTE — Patient Instructions (Signed)

## 2019-06-23 ENCOUNTER — Ambulatory Visit (INDEPENDENT_AMBULATORY_CARE_PROVIDER_SITE_OTHER): Payer: BC Managed Care – PPO | Admitting: Physical Therapy

## 2019-06-23 ENCOUNTER — Other Ambulatory Visit: Payer: Self-pay

## 2019-06-23 DIAGNOSIS — M545 Low back pain: Secondary | ICD-10-CM | POA: Diagnosis not present

## 2019-06-23 DIAGNOSIS — G8929 Other chronic pain: Secondary | ICD-10-CM | POA: Diagnosis not present

## 2019-06-23 NOTE — Therapy (Signed)
[place], [place], [address] Phone: [phone]   Fax:  [phone]  Physical Therapy Treatment  Patient Details  Name: Patrick Velasquez MRN: [id number] Date of Birth: 16-Jan-1991 Referring Provider (PT): Aundria Mems MD   Encounter Date: 06/23/2019  PT End of Session - 06/23/19 0804    Visit Number  3    Number of Visits  8    Date for PT Re-Evaluation  07/08/19    PT Start Time  0803    PT Stop Time  0836    PT Time Calculation (min)  33 min    Activity Tolerance  Patient tolerated treatment well;No increased pain    Behavior During Therapy  WFL for tasks assessed/performed       Past Medical History:  Diagnosis Date  . Acid reflux   . High cholesterol   . Osgood-Schlatter's disease     No past surgical history on file.  There were no vitals filed for this visit.  Subjective Assessment - 06/23/19 0804    Subjective  Pt reports he has been trying to "do the right things".  No new changes since last visit.    Patient Stated Goals  to help with his posture and prevent further back pain    Currently in Pain?  No/denies         [facility] PT Assessment - 06/23/19 0001      Assessment   Medical Diagnosis  chronic bil LBP without sciatica    Referring Provider (PT)  Aundria Mems MD    Onset Date/Surgical Date  04/29/19    Hand Dominance  Right    Next MD Visit  after therapy    Prior Therapy  no       [facility] Adult PT Treatment/Exercise - 06/23/19 0001      Self-Care   Self-Care  ADL's    ADL's  reviewed body mechanics for lifting weight from floor with squat, golfers lift for getting laundry out of washer, and kneeling for getting clothes out of dryer.       Lumbar Exercises: Stretches   Passive Hamstring Stretch  Right;Left;2 reps;30 seconds   supine    Hip Flexor Stretch  Right;Left;1 rep;30 seconds    Press Ups  5 reps   3 sec hold   ITB Stretch  Left;Right;1 rep;30  seconds   stretch only felt in adductors bilat   Piriformis Stretch  Right;Left;2 reps;30 seconds      Lumbar Exercises: Aerobic   Tread Mill  5 min, up to 3.2 mph      Lumbar Exercises: Standing   Wall Slides  5 reps   holding 5# in/out from core x 5 each rep     Lumbar Exercises: Seated   Sit to Stand  10 reps   TA engaged, slow and soft landing.    Sit to Stand Limitations  improved control      Lumbar Exercises: Sidelying   Other Sidelying Lumbar Exercises  side plank (modified) x 10 sec x 2 reps each side.       Lumbar Exercises: Prone   Opposite Arm/Leg Raise  Right arm/Left leg;Left arm/Right leg;5 reps      Lumbar Exercises: Quadruped   Opposite Arm/Leg Raise  Right arm/Left leg;Left arm/Right leg;5 reps             PT Education - 06/23/19 0846    Education Details  HEP - updated    Person(s) Educated  Patient    Methods  Explanation;Handout;Demonstration;Verbal cues    Comprehension  Returned demonstration;Verbalized understanding       PT Short Term Goals - 06/10/19 1310      PT SHORT TERM GOAL #1   Title  STG=LTG        PT Long Term Goals - 06/23/19 [id number]      PT LONG TERM GOAL #1   Title  Ind with HEP to prevent further injury    Time  4    Period  Weeks    Status  On-going      PT LONG TERM GOAL #2   Title  Patient able to demonstrate correct body mechanics for lifting and carrying to prevent further injury    Time  4    Period  Weeks    Status  On-going      PT LONG TERM GOAL #3   Title  Patient able to sit for one hour without increased pain in his low back.    Baseline  can sit 40 min prior to increased pain    Time  4    Period  Weeks    Status  On-going            Plan - 06/23/19 [id number]    Clinical Impression Statement  Pt reporting ability to sit for a length of time; progressing towards LTG#3.  Pt tolerated new strengthening exercises without difficulty or symptoms.  Progressing well towards goals.    Rehab Potential   Excellent    PT Frequency  2x / week    PT Duration  4 weeks    PT Treatment/Interventions  Electrical Stimulation;Moist Heat;Traction;Therapeutic exercise;Therapeutic activities;Neuromuscular re-education;Patient/family education;Manual techniques;Dry needling    PT Next Visit Plan  assess readiness to d/c.    PT Home Exercise Plan  access code:[email or web address]       Patient will benefit from skilled therapeutic intervention in order to improve the following deficits and impairments:     Visit Diagnosis: 1. Chronic midline low back pain, unspecified whether sciatica present        Problem List Patient Active Problem List   Diagnosis Date Noted  . Low back pain 05/27/2019   Mayer CamelJennifer Carlson-Long, PTA 06/23/19 8:47 AM  [facility] [address] [place], [place], [address] Phone: [phone]   Fax:  [phone]  Name: Oren BeckmannSamuel Gall MRN: [id number] Date of Birth: 1991-05-21

## 2019-06-25 ENCOUNTER — Encounter: Payer: Self-pay | Admitting: Physical Therapy

## 2019-07-01 ENCOUNTER — Encounter: Payer: Self-pay | Admitting: Physical Therapy

## 2019-07-01 ENCOUNTER — Telehealth: Payer: Self-pay | Admitting: Physical Therapy

## 2019-07-01 NOTE — Telephone Encounter (Signed)
Patient did not show for physical therapy appt.  Called patient and left HIPAA compliant voice mail regarding missed appt and requested he call our dept back to reschedule.   Kerin Perna, PTA 07/01/19 7:31 AM

## 2019-07-03 ENCOUNTER — Ambulatory Visit (INDEPENDENT_AMBULATORY_CARE_PROVIDER_SITE_OTHER): Payer: BC Managed Care – PPO | Admitting: Physical Therapy

## 2019-07-03 ENCOUNTER — Other Ambulatory Visit: Payer: Self-pay

## 2019-07-03 ENCOUNTER — Encounter: Payer: Self-pay | Admitting: Physical Therapy

## 2019-07-03 DIAGNOSIS — M545 Low back pain, unspecified: Secondary | ICD-10-CM

## 2019-07-03 DIAGNOSIS — G8929 Other chronic pain: Secondary | ICD-10-CM

## 2019-07-03 NOTE — Therapy (Addendum)
[place], [place], [address] Phone: [phone]   Fax:  [phone]  Physical Therapy Treatment/Discharge Summary  Patient Details  Name: Patrick Velasquez MRN: [id number] Date of Birth: Jul 01, 1991 Referring Provider (PT): Aundria Mems MD   Encounter Date: 07/03/2019  PT End of Session - 07/03/19 0852    Visit Number  4    Number of Visits  8    Date for PT Re-Evaluation  07/08/19    PT Start Time  0848    PT Stop Time  0918    PT Time Calculation (min)  30 min    Activity Tolerance  Patient tolerated treatment well;No increased pain    Behavior During Therapy  WFL for tasks assessed/performed       Past Medical History:  Diagnosis Date  . Acid reflux   . High cholesterol   . Osgood-Schlatter's disease     History reviewed. No pertinent surgical history.  There were no vitals filed for this visit.  Subjective Assessment - 07/03/19 0853    Subjective  Pt reports he is now more aware of when he is moving incorrectly.  Has made a conscience effort to correct his movement patterns. He can now sit 1 hr without pain, as long as he is not in his deep couch.    Pertinent History  disc protrusion L5/S1    Currently in Pain?  No/denies       [facility] Adult PT Treatment/Exercise - 07/03/19 0001      Lumbar Exercises: Stretches   Passive Hamstring Stretch  --   verbally reviewed, already performed prior to arrival .   Hip Flexor Stretch  Right;Left;2 reps;30 seconds    Hip Flexor Stretch Limitations  one rep in thomas position    Other Lumbar Stretch Exercise  modified downward dog with hands on low mat -pt unable to complete without flexed low back, stopped.       Lumbar Exercises: Aerobic   Tread Mill  1.9 x 1.5 min (pt reported he had already warmed up prior to arrival.)      Lumbar Exercises: Standing   Other Standing Lumbar Exercises  Forward T (per HEP) x 5 reps each side.     Other  Standing Lumbar Exercises  wall angel x 5 reps       Lumbar Exercises: Sidelying   Other Sidelying Lumbar Exercises  thoracic rotation with red band x 10 reps each side.       Lumbar Exercises: Prone   Opposite Arm/Leg Raise  Right arm/Left leg;Left arm/Right leg;5 reps    Other Prone Lumbar Exercises  goal post to superman x 5      Lumbar Exercises: Quadruped   Opposite Arm/Leg Raise  Right arm/Left leg;Left arm/Right leg;5 reps;5 seconds    Other Quadruped Lumbar Exercises  thoracic rotation stretch, reaching arm to ceiling x 1 rep each side x 15 sec             PT Education - 07/03/19 0918    Education Details  HEP - added strengthening    Person(s) Educated  Patient    Methods  Explanation;Demonstration;Handout;Verbal cues    Comprehension  Verbalized understanding;Returned demonstration       PT Short Term Goals - 06/10/19 1310      PT SHORT TERM GOAL #1   Title  STG=LTG        PT Long Term Goals - 07/03/19 8676      PT  LONG TERM GOAL #1   Title  Ind with HEP to prevent further injury    Time  4    Period  Weeks    Status  Achieved      PT LONG TERM GOAL #2   Title  Patient able to demonstrate correct body mechanics for lifting and carrying to prevent further injury    Time  4    Period  Weeks    Status  Achieved      PT LONG TERM GOAL #3   Title  Patient able to sit for one hour without increased pain in his low back.    Time  4    Period  Weeks    Status  Achieved            Plan - 07/03/19 8676    Clinical Impression Statement  Pt tolerated all new postural strengthening exercises well, without any production of symptoms.  Pt has met all goals and is agreeable to d/c at this time.    Rehab Potential  Excellent    PT Frequency  2x / week    PT Duration  4 weeks    PT Treatment/Interventions  Electrical Stimulation;Moist Heat;Traction;Therapeutic exercise;Therapeutic activities;Neuromuscular re-education;Patient/family education;Manual  techniques;Dry needling    PT Next Visit Plan  spoke to supervising PT; will d/c at this time.    PT Home Exercise Plan  access code:[email or web address]    Consulted and Agree with Plan of Care  Patient       Patient will benefit from skilled therapeutic intervention in order to improve the following deficits and impairments:  Decreased mobility, Impaired flexibility, Decreased activity tolerance  Visit Diagnosis: Chronic midline low back pain, unspecified whether sciatica present     Problem List Patient Active Problem List   Diagnosis Date Noted  . Low back pain 05/27/2019   Kerin Perna, PTA 07/03/19 9:26 AM  [facility] [place], [place], [address] Phone: [phone]   Fax:  [phone]  Name: Patrick Velasquez MRN: [id number] Date of Birth: [birth year]     PHYSICAL THERAPY DISCHARGE SUMMARY  Visits from Start of Care: 4  Current functional level related to goals / functional outcomes: See above   Remaining deficits: See above   Education / Equipment: HEP  Plan: Patient agrees to discharge.  Patient goals were met. Patient is being discharged due to meeting the stated rehab goals.  ?????    Laureen Abrahams, PT, DPT 07/30/19 8:32 AM  [facility] [place], [place] [address]  [phone] (office) [phone] (fax)

## 2019-07-03 NOTE — Patient Instructions (Signed)
Access Code: [email or web address]  URL: [email or web address]  Date: 07/03/2019  Prepared by: Kerin Perna   Exercises  Added: Modified Superman on Table - 10 reps - 3 sets - 1x daily - 7x weekly  Wall Angels - 5-10 reps - 1 sets - 1x daily - 7x weekly  Sidelying Thoracic and Shoulder Rotation - 10 reps - 1 sets - 1x daily - 7x weekly  Forward T - 10 reps - 1 sets - 1x daily - 7x weekly  Seated Hip Flexor Stretch - 2 reps - 1 sets - 30 hold - 1x daily - 7x weekly

## 2020-11-17 IMAGING — DX LUMBAR SPINE FLEX AND EXTEND ONLY - 2-3 VIEW
3 series · 3 of 3 positions shown · non-contrast
Comparison: 11/25/2017.

CLINICAL DATA: Chronic low back pain and tenderness. No known
injury.

EXAM:
LUMBAR SPINE FLEX AND EXTEND ONLY - 2-3 VIEW

[l-spine lat]
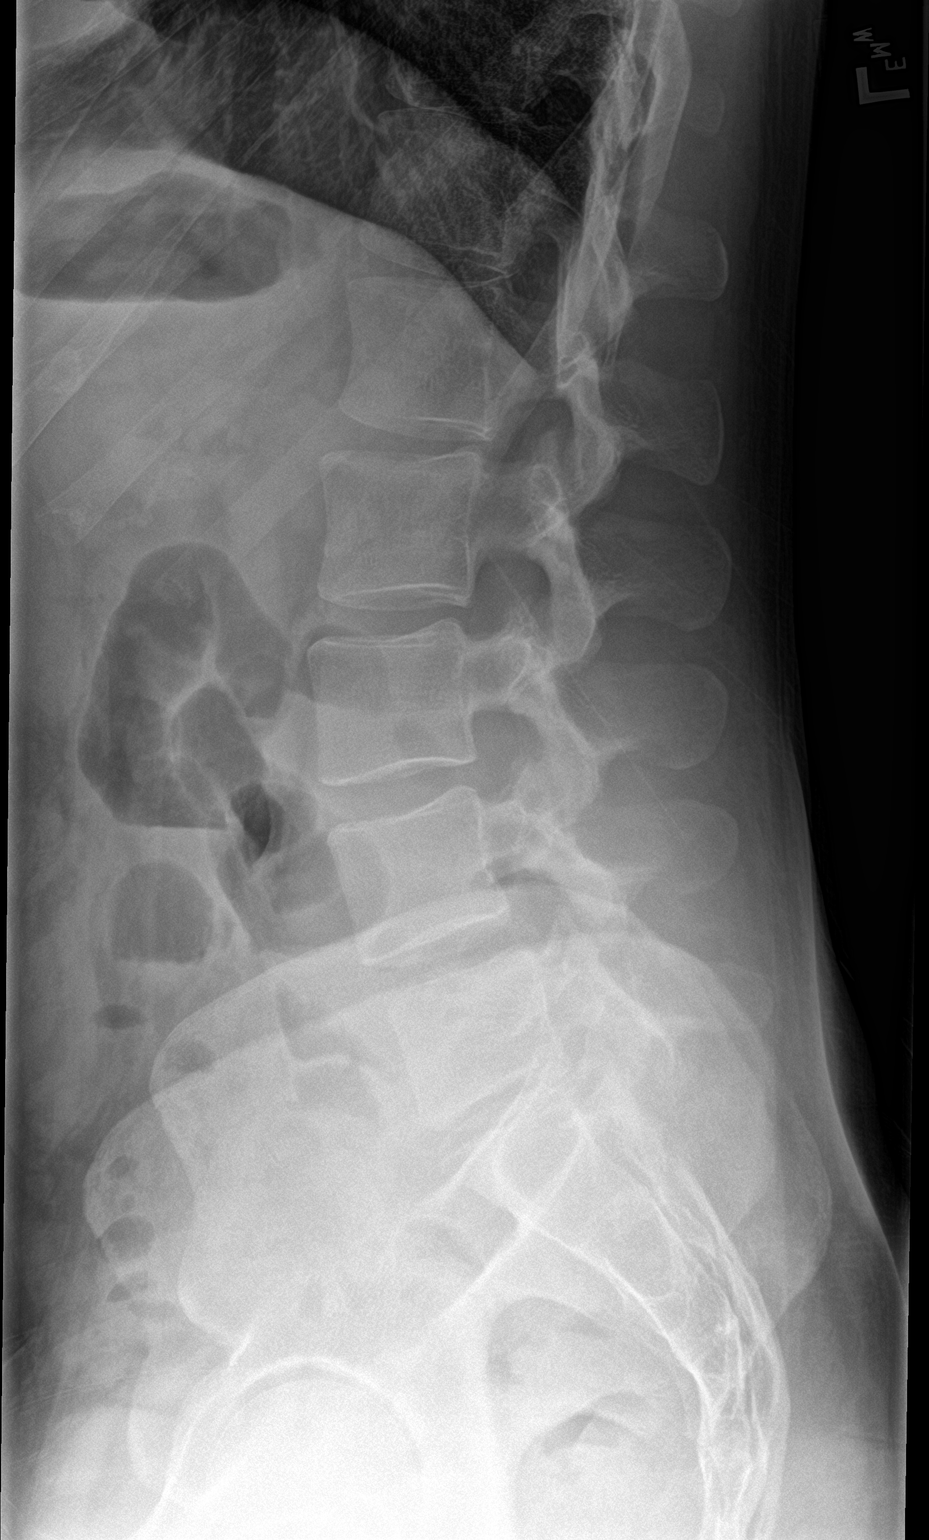

[l-spine flex]
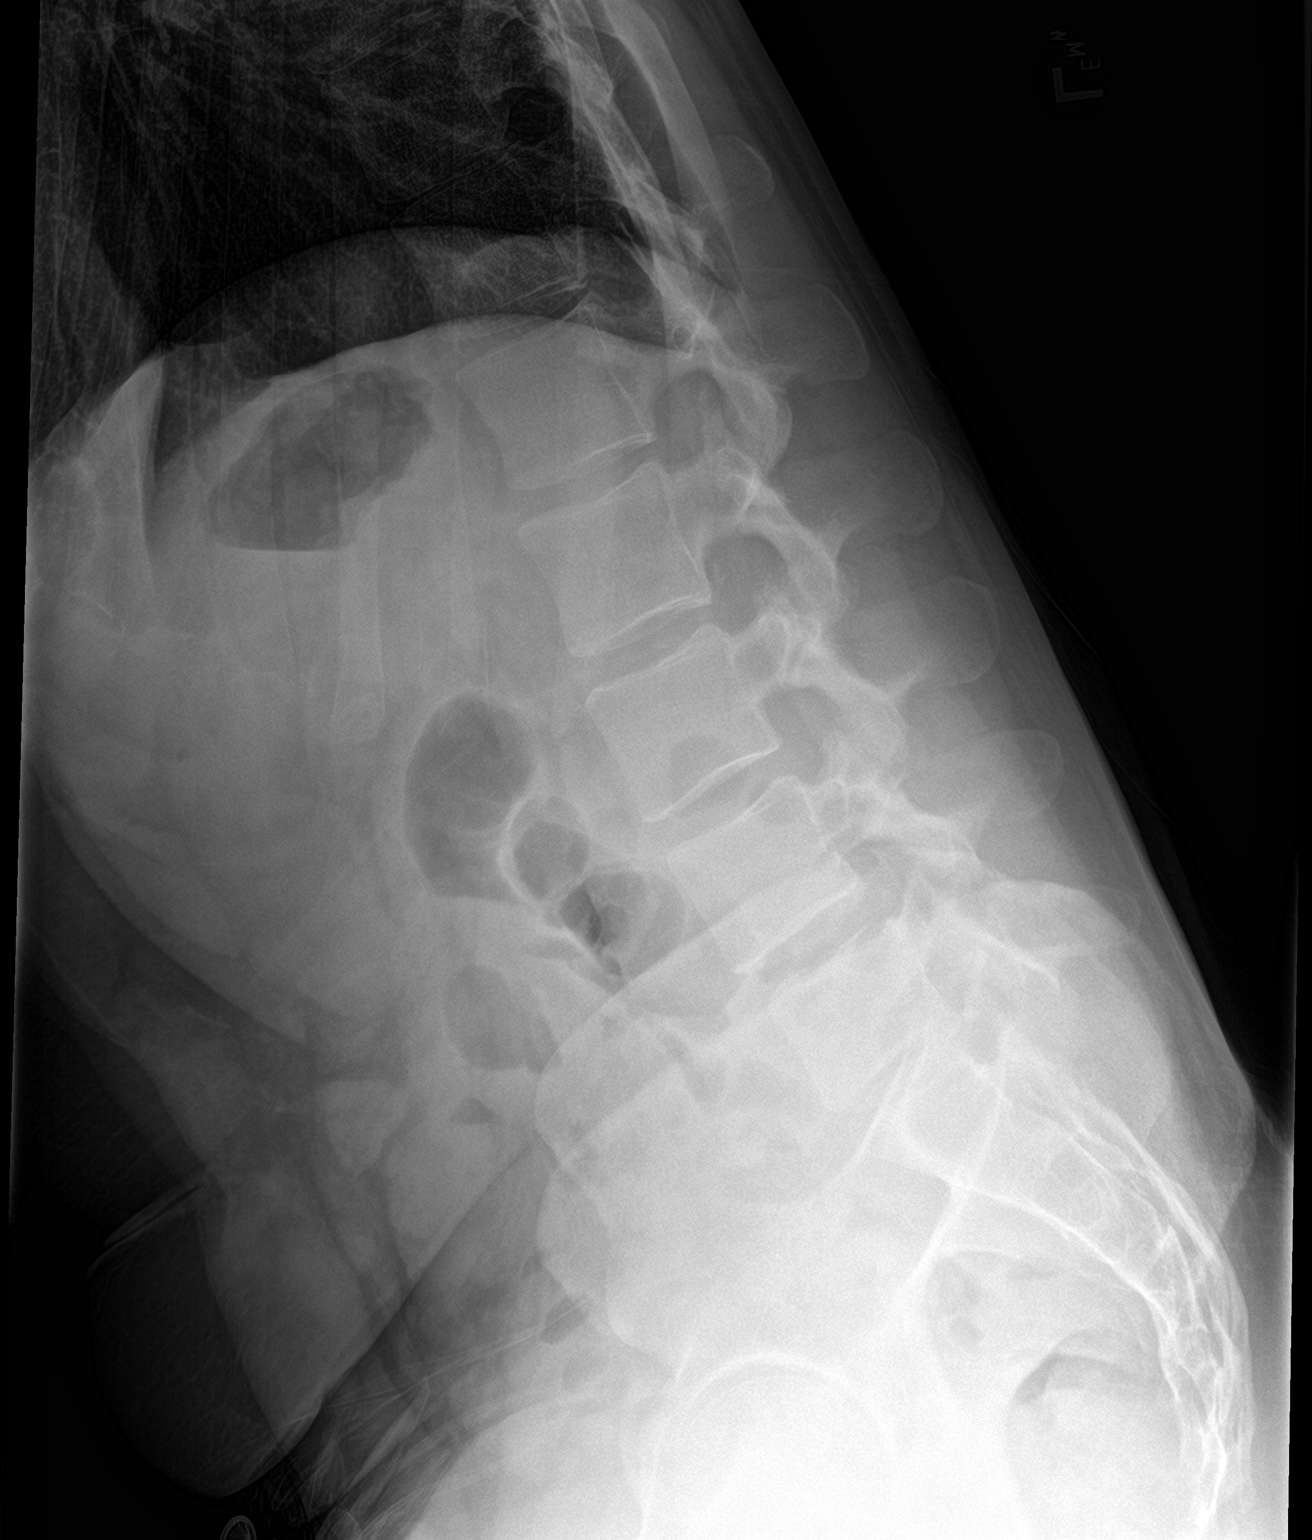

[l-spine ext]
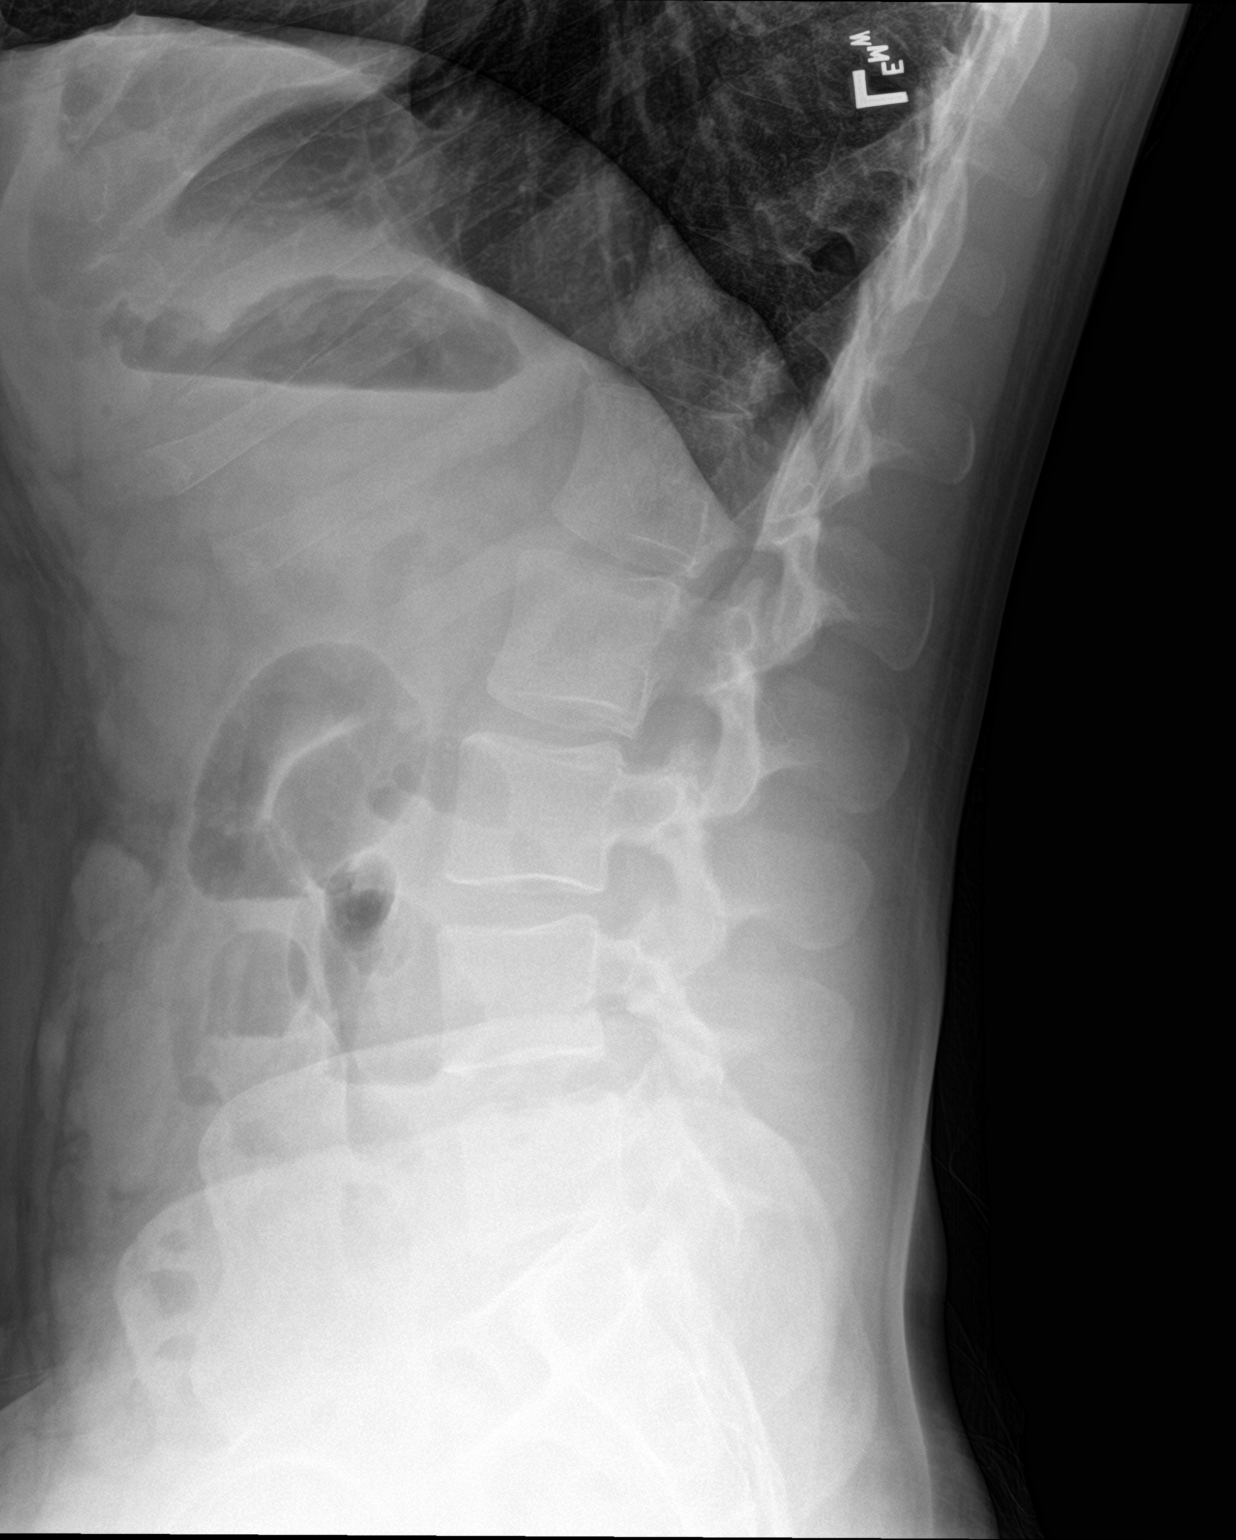

[3 of 3 positions shown; findings below may reference images not displayed]

FINDINGS: Five non-rib-bearing lumbar vertebrae. Mild facet hypertrophy and
widening of the facet joints at the L4-5 level with associated 3 mm
of anterolisthesis in the neutral position, unchanged with flexion
and extension. No pars defects are seen.
IMPRESSION: Mild facet degenerative changes at the L4-5 level with associated
grade 1 anterolisthesis, stable with flexion and extension.

## 2020-11-28 IMAGING — MR MRI LUMBAR SPINE WITHOUT CONTRAST
4 of 5 series · 27 of 48 positions shown · non-contrast
Comparison: None.

CLINICAL DATA: Low back pain, greater than 6 weeks, persistent
symptoms.

EXAM:
MRI LUMBAR SPINE WITHOUT CONTRAST
TECHNIQUE: Multiplanar, multisequence MR imaging of the lumbar spine was
performed. No intravenous contrast was administered.

[Series 4: T2 · sagittal · 4.0mm · 0.81mm/px · 6 of 15 slices shown (1 of 2)]
[im 1/15]
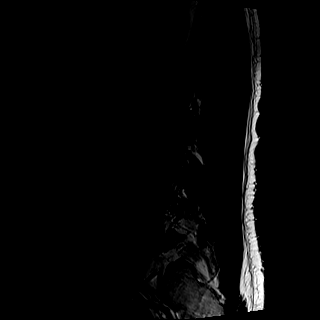
[im 3/15]
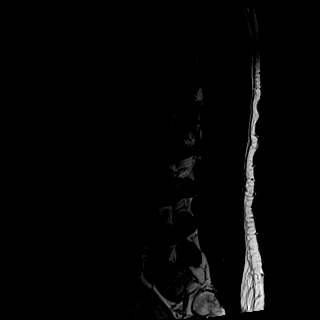
[im 6/15]
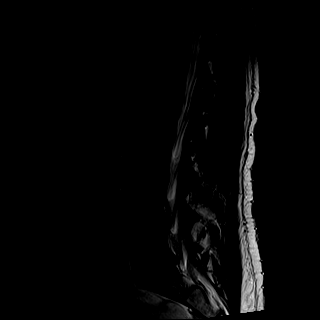
[im 9/15]
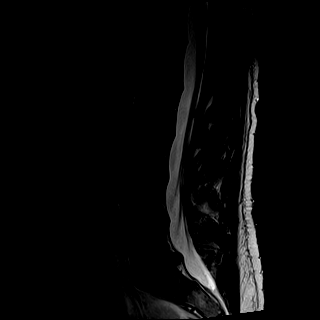
[im 12/15]
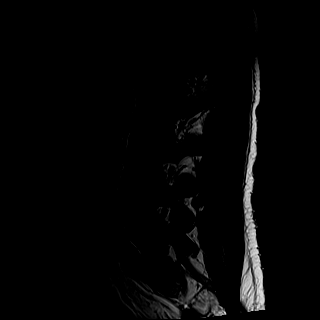
[im 15/15]
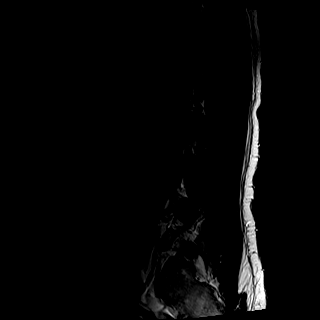

[Series 5: T1 · sagittal · 4.0mm · 0.41mm/px · 5 of 15 slices shown (1 of 2)]
[im 1/15]
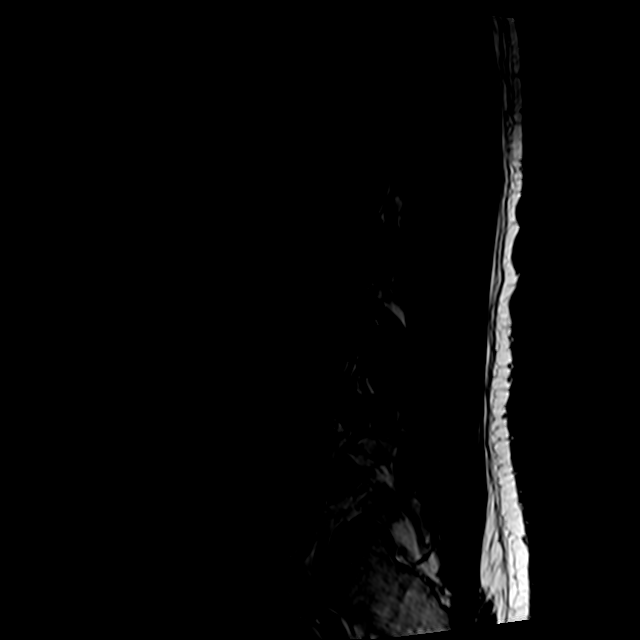
[im 4/15]
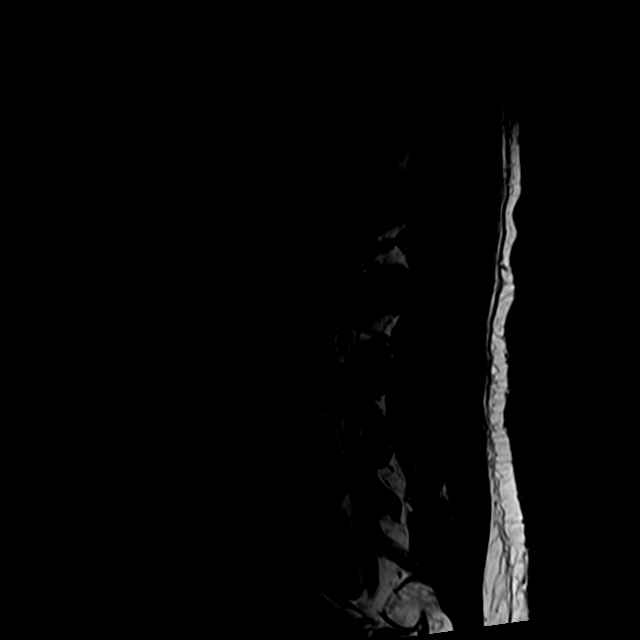
[im 8/15]
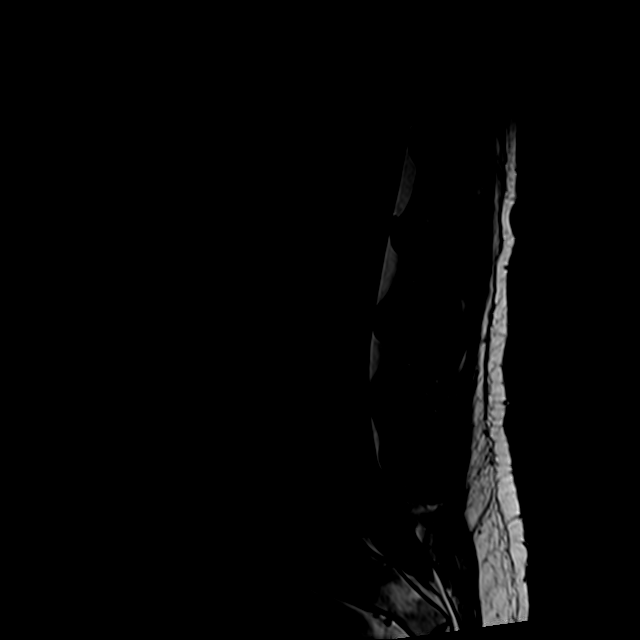
[im 11/15]
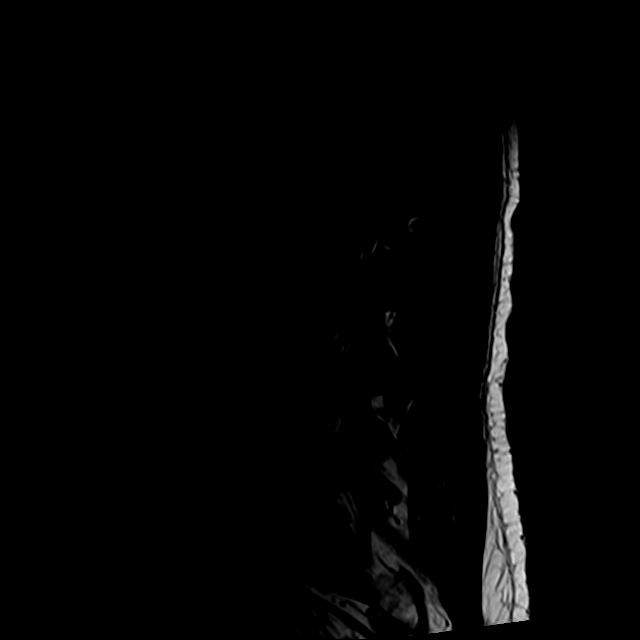
[im 15/15]
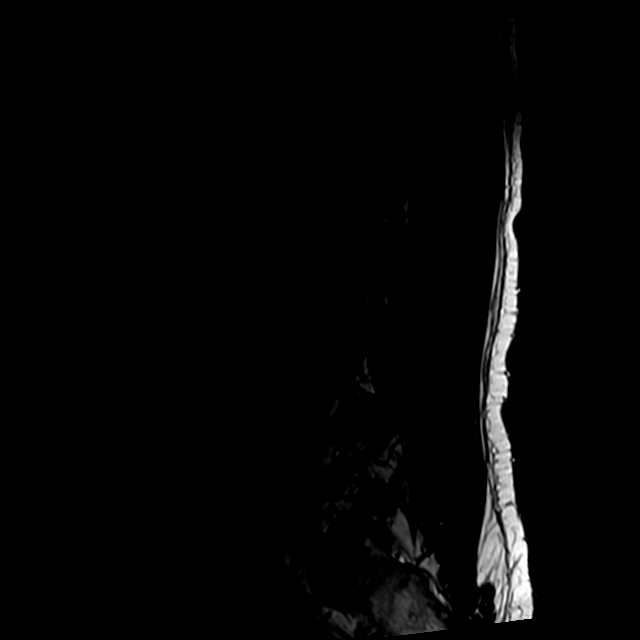

[Series 7: T2 · axial · 4.0mm · 0.78mm/px · z∈[-150,+73]mm · 10 of 43 slices shown (2 of 2)]
[im 3/43]
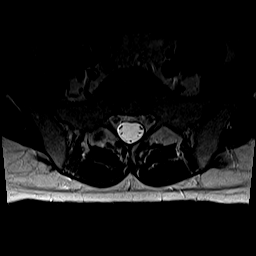
[im 6/43]
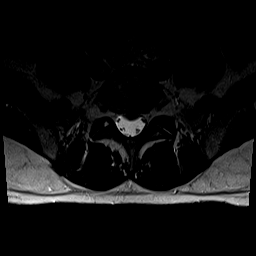
[im 9/43]
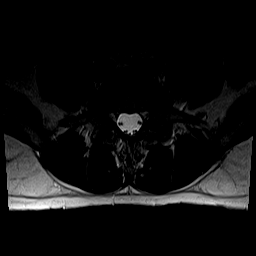
[im 15/43]
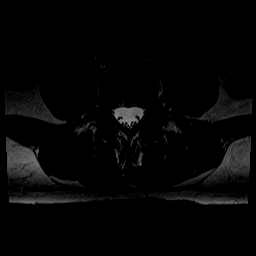
[im 20/43]
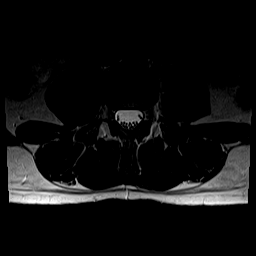
[im 23/43]
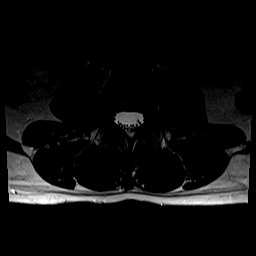
[im 26/43]
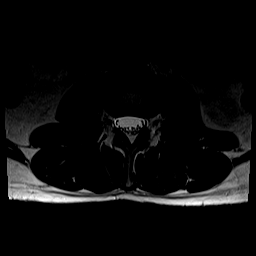
[im 31/43]
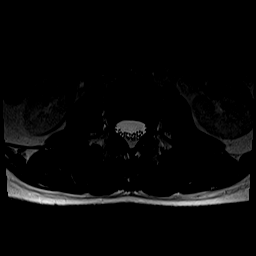
[im 37/43]
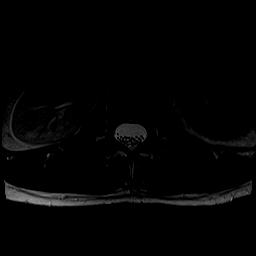
[im 43/43]
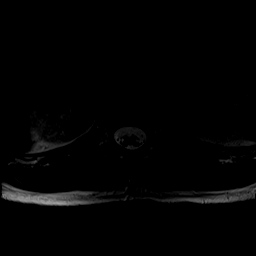

[Series 8: T1 · axial · 4.0mm · 0.39mm/px · z∈[-150,+43]mm · 6 of 43 slices shown (2 of 2)]
[im 3/43]
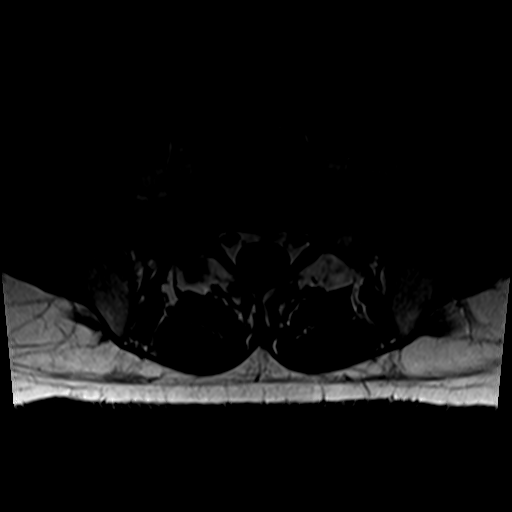
[im 6/43]
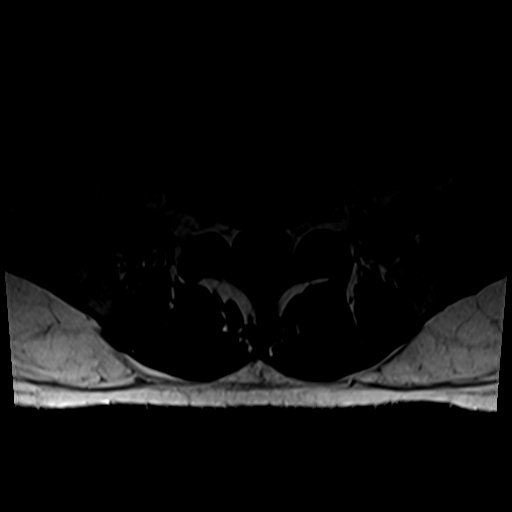
[im 9/43]
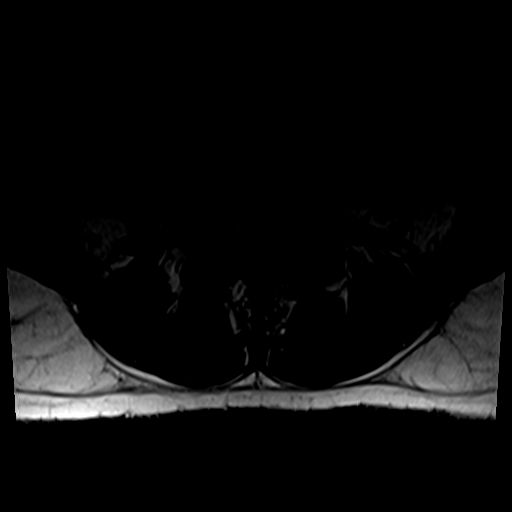
[im 15/43]
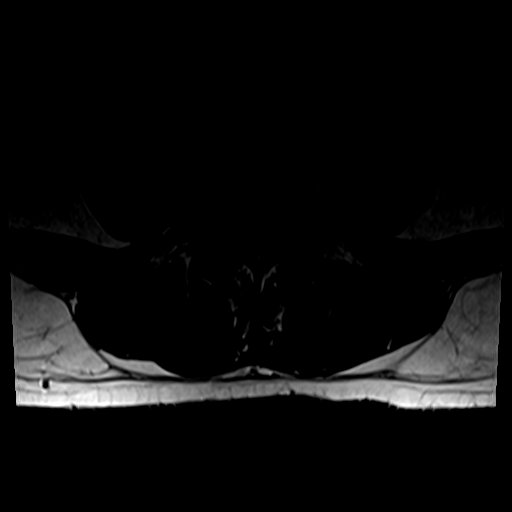
[im 23/43]
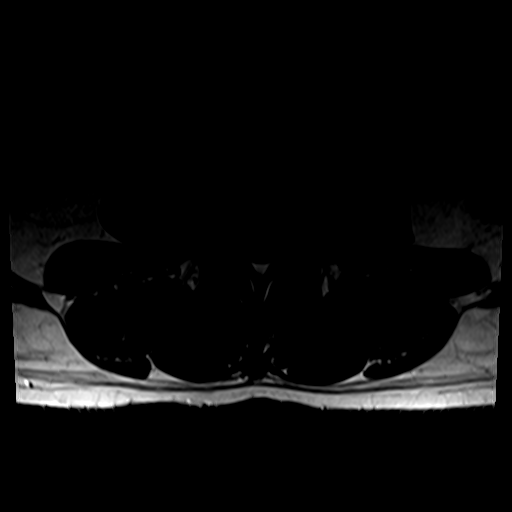
[im 37/43]
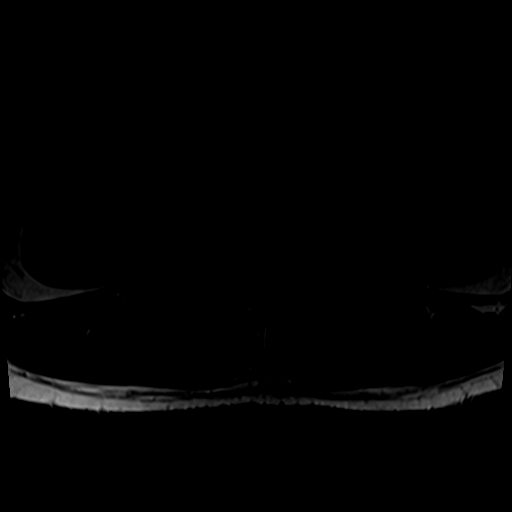

[27 of 48 positions shown; findings below may reference images not displayed]

FINDINGS: Segmentation: 5 non rib-bearing lumbar type vertebral bodies are
present. The lowest fully formed vertebral body is L5.

Alignment:  AP alignment is anatomic

Vertebrae:  Mild endplate marrow changes are present at L5-S1.

Conus medullaris and cauda equina: Conus extends to the T12-L1
level. Conus and cauda equina appear normal.

Paraspinal and other soft tissues: Limited imaging the abdomen is
unremarkable. There is no significant adenopathy. No solid organ
lesions are present.

Disc levels:

L1-2: Negative.

L2-3: Negative.

L3-4: Negative.

L4-5: Mild disc bulging and facet hypertrophy are present without
significant stenosis.

L5-S1: A central disc extrusion is present. While there is no
significant compressive stenosis, there is probable contact of the
S1 nerve roots bilaterally.
IMPRESSION: 1. Central disc extrusion at L5-S1 with probable contact of the
traversing S1 nerve roots bilaterally.
2. Mild disc bulging and facet hypertrophy at L4-5 without
significant stenosis.
